# Patient Record
Sex: Male | Born: 1975 | Hispanic: No | Marital: Single | Smoking: Current some day smoker
Health system: Southern US, Community
[De-identification: ages and names within clinical notes are randomized; demographics above are authoritative.]

## PROBLEM LIST (undated history)

## (undated) DIAGNOSIS — F329 Major depressive disorder, single episode, unspecified: Secondary | ICD-10-CM

## (undated) DIAGNOSIS — F32A Depression, unspecified: Secondary | ICD-10-CM

## (undated) HISTORY — DX: Major depressive disorder, single episode, unspecified: F32.9

## (undated) HISTORY — DX: Depression, unspecified: F32.A

---

## 2017-07-29 ENCOUNTER — Other Ambulatory Visit: Payer: Self-pay | Admitting: Nurse Practitioner

## 2017-07-29 ENCOUNTER — Ambulatory Visit
Admission: RE | Admit: 2017-07-29 | Discharge: 2017-07-29 | Disposition: A | Discharge status: Home / Self Care | Payer: No Typology Code available for payment source | Source: Ambulatory Visit | Attending: Nurse Practitioner | Admitting: Nurse Practitioner

## 2017-07-29 DIAGNOSIS — R071 Chest pain on breathing: Secondary | ICD-10-CM

## 2017-08-06 ENCOUNTER — Encounter (HOSPITAL_COMMUNITY): Payer: Self-pay

## 2017-08-06 ENCOUNTER — Emergency Department (HOSPITAL_COMMUNITY)
Admission: EM | Admit: 2017-08-06 | Discharge: 2017-08-06 | Disposition: A | Discharge status: Home / Self Care | Payer: No Typology Code available for payment source | Attending: Emergency Medicine | Admitting: Emergency Medicine

## 2017-08-06 DIAGNOSIS — G4700 Insomnia, unspecified: Secondary | ICD-10-CM

## 2017-08-06 DIAGNOSIS — Z713 Dietary counseling and surveillance: Secondary | ICD-10-CM | POA: Insufficient documentation

## 2017-08-06 LAB — BASIC METABOLIC PANEL
ANION GAP: 8 (ref 5–15)
BUN: 10 mg/dL (ref 6–20)
CALCIUM: 9.2 mg/dL (ref 8.9–10.3)
CHLORIDE: 103 mmol/L (ref 101–111)
CO2: 25 mmol/L (ref 22–32)
Creatinine, Ser: 0.79 mg/dL (ref 0.61–1.24)
GFR calc non Af Amer: >60 mL/min (ref >60)
Glucose, Bld: 114 mg/dL — ABNORMAL HIGH (ref 65–99)
Potassium: 3.8 mmol/L (ref 3.5–5.1)
Sodium: 136 mmol/L (ref 135–145)

## 2017-08-06 LAB — CBC
HCT: 46.6 % (ref 39.0–52.0)
HEMOGLOBIN: 16.2 g/dL (ref 13.0–17.0)
MCH: 30.8 pg (ref 26.0–34.0)
MCHC: 34.8 g/dL (ref 30.0–36.0)
MCV: 88.6 fL (ref 78.0–100.0)
Platelets: 324 10*3/uL (ref 150–400)
RBC: 5.26 MIL/uL (ref 4.22–5.81)
RDW: 12.6 % (ref 11.5–15.5)
WBC: 8.3 10*3/uL (ref 4.0–10.5)

## 2017-08-06 LAB — URINALYSIS, ROUTINE W REFLEX MICROSCOPIC
Bilirubin Urine: NEGATIVE
Glucose, UA: NEGATIVE mg/dL
HGB URINE DIPSTICK: NEGATIVE
Ketones, ur: NEGATIVE mg/dL
Leukocytes, UA: NEGATIVE
Nitrite: NEGATIVE
Protein, ur: NEGATIVE mg/dL
SPECIFIC GRAVITY, URINE: 1.004 — AB (ref 1.005–1.030)
pH: 6 (ref 5.0–8.0)

## 2017-08-06 MED ORDER — ZOLPIDEM TARTRATE 5 MG PO TABS: 5.0000 mg | ORAL_TABLET | Freq: Every evening | ORAL | 0 refills | Status: DC | PRN

## 2017-08-06 NOTE — ED Triage Notes (Signed)
Pt states he started having weakness x 2 weeks and unable to sleep; pt states he was seen at clinic and was told his Cholesterol and WBC was elevated; pt is VAN negative. Pt a&ox 4 on arrival and denies pain

## 2017-08-06 NOTE — ED Provider Notes (Signed)
[facility]-EMERGENCY DEPT Provider Note   CSN: [id number] Arrival date & time: 08/06/17  0144   History   Chief Complaint Chief Complaint  Patient presents with  . Weakness    HPI Raymond Lara is a 41 y.o. male.  HPI  Patient with no significant past medical history comes to the ER for evaluation of not being able to sleep for two days and weakness.  A couple months ago he changed his diet to only eating tuna and chicken. He has lost a lot of weight and has felt progressively weak. He did this change to be healthy and for weight loss. He went to a clinic on 9/10 and had a CBC, CMP, lipid panel, and TSH drawn. His cholesterol was 201 (>200 abnormal) and his WBC was very slightly elevated. Now he notices he can't eat as much. He saw a friend he had not seen in a long time a few days ago who told him he looked sick because he is so thin and has been very stressed about this. He is having a hard time sleeping the past two days. He has tried drinking herbal tea and has been taking a supplement he got from [place]. He has not had any fevers, headaches, neck pain, back pain, coughing, he has not traveled out of the country recently.  Patient requests medication for sleep. That is his biggest concern.    Home Medications    Prior to Admission medications   Not on File    Family History No family history on file.  Social History Social History  Substance Use Topics  . Smoking status: Never Smoker  . Smokeless tobacco: Not on file  . Alcohol use No     Allergies   Patient has no known allergies.   Review of Systems Review of Systems  The patient denies anorexia, fever, vision loss, decreased hearing, hoarseness, chest pain, syncope, dyspnea on exertion, peripheral edema, balance deficits, hemoptysis, abdominal pain, melena, hematochezia, severe indigestion/heartburn, hematuria, incontinence, genital sores, muscle weakness, suspicious skin lesions, transient blindness, difficulty  walking, depression, unusual weight change, abnormal bleeding, enlarged lymph nodes, angioedema, and breast masses.  Physical Exam Updated Vital Signs BP (!) 131/91   Pulse 80   Temp 98.6 F (37 C) (Oral)   Resp 12   Ht  (1.676 m)   Wt 75.3 kg (166 lb)   SpO2 99%   BMI 26.79 kg/m   Physical Exam  Constitutional: He appears well-developed and well-nourished. No distress.  HENT:  Head: Normocephalic and atraumatic.  Right Ear: Tympanic membrane and ear canal normal.  Left Ear: Tympanic membrane and ear canal normal.  Nose: Nose normal.  Mouth/Throat: Uvula is midline, oropharynx is clear and moist and mucous membranes are normal.  Eyes: Pupils are equal, round, and reactive to light.  Neck: Normal range of motion. Neck supple.  Cardiovascular: Normal rate and regular rhythm.   Pulmonary/Chest: Effort normal.  Abdominal: Soft.  No signs of abdominal distention  Musculoskeletal:  No LE swelling  Neurological: He is alert.  Acting at baseline  Skin: Skin is warm and dry. No rash noted.  Nursing note and vitals reviewed.    ED Treatments / Results  Labs (all labs ordered are listed, but only abnormal results are displayed) Labs Reviewed  BASIC METABOLIC PANEL - Abnormal; Notable for the following:       Result Value   Glucose, Bld 114 (*)    All other components within normal limits  URINALYSIS, ROUTINE  W REFLEX MICROSCOPIC - Abnormal; Notable for the following:    Color, Urine STRAW (*)    Specific Gravity, Urine 1.004 (*)    All other components within normal limits  CBC  CBG MONITORING, ED    EKG  EKG Interpretation  Date/Time:  Tuesday August 06 2017 02:05:15 EDT Ventricular Rate:  79 PR Interval:  152 QRS Duration: 86 QT Interval:  348 QTC Calculation: 399 R Axis:   72 Text Interpretation:  Normal sinus rhythm Abnormal QRS-T angle, consider primary T wave abnormality Abnormal ECG T wave changes inferiorly No prior for comparison Confirmed by  Ross Marcus ([id number]) on 08/06/2017 5:53:14 AM Also confirmed by Ross Marcus ([id number]), editor Barbette Hair [phone])  on 08/06/2017 7:12:34 AM       Radiology No results found.  Procedures Procedures (including critical care time)  Medications Ordered in ED Medications - No data to display   Initial Impression / Assessment and Plan / ED Course  I have reviewed the triage vital signs and the nursing notes.  Pertinent labs & imaging results that were available during my care of the patient were reviewed by me and considered in my medical decision making (see chart for details).   Urinalysis, BMP, CBC and EKG are unremarkable. We discussed diet and weight loss. I advised that he needs to eat some carbs (chips, tortillas, bread, grains) and healthy fats (avocado) for energy or he will continue to feel weak. I will give him a resource guide to healthy eating.  He is otherwise well appearing. I have asked him to follow-up with his primary care clinic in 1-2 weeks for recheck after sleepign and making some of the recommended changes. -- I have asked nurse to weigh patient and get height to calculate BMI for trending.  Blood pressure 122/87, pulse 96, temperature 98.6 F (37 C), temperature source Oral, resp. rate (!) 21, SpO2 98 %. Weight is 166 lb  Raymond Lara has been evaluated today in the emergency department. The appropriate screening and testing was been performed and I believe the patient to be medically stable for discharge.   Return signs and symptoms have been discussed with the patient and/or caregivers and they have voiced their understanding. The patient has agreed to follow-up with their primary care provider or the referred specialist.    Final Clinical Impressions(s) / ED Diagnoses   Final diagnoses:  Insomnia, unspecified type  Encounter for weight loss counseling    New Prescriptions New Prescriptions   No medications on file     Marlon Pel,  PA-C 08/06/17 0804    Shon Baton, MD 08/07/17 [phone]

## 2017-08-06 NOTE — ED Notes (Signed)
ED Provider at bedside. 

## 2017-11-25 ENCOUNTER — Ambulatory Visit: Payer: Self-pay | Admitting: Physician Assistant

## 2017-11-25 ENCOUNTER — Other Ambulatory Visit: Payer: Self-pay

## 2017-11-25 ENCOUNTER — Encounter: Payer: Self-pay | Admitting: Physician Assistant

## 2017-11-25 VITALS — BP 147/94 | HR 97 | Temp 98.7°F | Resp 18 | Ht 64.96 in | Wt 180.2 lb

## 2017-11-25 DIAGNOSIS — F419 Anxiety disorder, unspecified: Secondary | ICD-10-CM

## 2017-11-25 DIAGNOSIS — F329 Major depressive disorder, single episode, unspecified: Secondary | ICD-10-CM

## 2017-11-25 DIAGNOSIS — G4700 Insomnia, unspecified: Secondary | ICD-10-CM

## 2017-11-25 MED ORDER — ESCITALOPRAM OXALATE 10 MG PO TABS: 10.0000 mg | ORAL_TABLET | Freq: Every day | ORAL | 0 refills | Status: AC

## 2017-11-25 MED ORDER — HYDROXYZINE HCL 25 MG PO TABS: 12.5000 mg | ORAL_TABLET | Freq: Every evening | ORAL | 0 refills | Status: AC | PRN

## 2017-11-25 NOTE — Progress Notes (Signed)
Raymond Lara  MRN: [id number] DOB: [birth year]  Subjective:  Raymond Lara is a 42 y.o. male seen in office today for a chief complaint of trouble sleeping x few months. He works as a Education administrator, gets home around [employer], listens to music, and tries to go to sleep around 9-10pm. Will have trouble falling asleep. Avoids TV and screen time before sleep. He does listen to music in headphones to ease mind before bed. Has had this issue in the past. He exercises frequently. Typically drinks coffee but not after 8am. Has been drinking at least 4 cans of beer at night to help sleep. Has tried melatonin for the past 3 days, with no relief.Does have some anxiety and depression x 4 years. Worsened over the past couple months due to holidays.  Sad because he is alone. He is anxious about the fact that he is alone. He is from [place] and most of his family is there. Does have one sister here. Has not been able to go home and visit. Has never seen a therapist for anxiety or depression.  Does often think he is ready to be with God but does not have any suicidal plans or hx of self injury. Deneis fever, chills, nausea, vomiting, chest pain, palpitations, and dizziness. Has also tried amytriptyline 10mg  x 2 months but then stopped, has restarted x 3 days ago at night time, did not help with sleep or depression. He was also given Rx for #10 ambien 5mg  tablets in 08/06/17, but he does not remember if these were effective. Denies smoking or illicit drug use. Stratus Interpreter used. Melanee Spry #[id number].  Review of Systems  Constitutional: Positive for fatigue. Negative for chills, diaphoresis and unexpected weight change.  Respiratory: Negative for cough and shortness of breath.   Cardiovascular: Negative for leg swelling.  Gastrointestinal: Negative for abdominal pain, nausea and vomiting.  Psychiatric/Behavioral: Negative for agitation, confusion and hallucinations.    There are no active problems to display for this  patient.   Current Outpatient Medications on File Prior to Visit  Medication Sig Dispense Refill  . lovastatin (MEVACOR) 10 MG tablet Take 10 mg by mouth at bedtime.    . Melatonin 10 MG CAPS Take 10 mg by mouth.    . niacin 250 MG tablet Take 250 mg by mouth at bedtime.    . ST JOHNS WORT EXTRACT PO Take 700 mg by mouth.     No current facility-administered medications on file prior to visit.     No Known Allergies   Objective:  BP (!) 147/94 (BP Location: Right Arm, Patient Position: Sitting)   Pulse 97   Temp 98.7 F (37.1 C) (Oral)   Resp 18   Ht 5' 4.96" (1.65 m)   Wt 180 lb 3.2 oz (81.7 kg)   SpO2 98%   BMI 30.02 kg/m   Physical Exam  Constitutional: He is oriented to person, place, and time and well-developed, well-nourished, and in no distress.  HENT:  Head: Normocephalic and atraumatic.  Eyes: Conjunctivae are normal.  Neck: Normal range of motion. No thyromegaly present.  Cardiovascular: Normal rate, regular rhythm and normal heart sounds.  Pulmonary/Chest: Effort normal and breath sounds normal. He has no wheezes. He has no rhonchi. He has no rales.  Neurological: He is alert and oriented to person, place, and time. Gait normal.  Skin: Skin is warm and dry.  Psychiatric: Affect normal. He exhibits a depressed mood.  Vitals reviewed.   BP Readings from Last 3 Encounters:  11/25/17 (!) 147/94  08/06/17 (!) 125/91   Depression screen PHQ 2/9 11/25/2017 11/25/2017  Decreased Interest 2 0  Down, Depressed, Hopeless 3 0  PHQ - 2 Score 5 0  Altered sleeping 3 -  Tired, decreased energy 3 -  Change in appetite 0 -  Feeling bad or failure about yourself  0 -  Trouble concentrating 0 -  Moving slowly or fidgety/restless 0 -  Suicidal thoughts 3 -  PHQ-9 Score 14 -   Unable to perform GAD7 due to language barrier.   Assessment and Plan :  1. Insomnia, unspecified type Per review of ED notes, labs revealed normal CBC and BMP. TSH pending. Hx suspicious for  uncontrolled anxiety and depression, likely contributing to insomnia. No acute findings noted on exam. Educated on sleep hygiene. Recommended avoiding alcohol use to help with sleep. Avoid earbuds with music while lying in bed. It appears that pt would strongly benefit from both medication and therapy for better control of anxiety and depression at this time.He is not actively suicidal and does not have a suicidal plan.  Recommend discontinuing amtriptyline at this time and begin lexapro daily. Educated on how to use hydroxyzine at night prn for help with racing thoughts while lexapro is taking effect. Educated on potential side effects of both medications. Pt gave me amitriptyline Rx bottle to discard remaining tablets. There were discarded by CMA Bonita QuinLinda. Given GoodRx card to take with him when he picks up Rx's.  Avoid combining new medication with alcohol.  Given contact info for local Spanish speaking therapists. Plan to follow up in 4 weeks for reevaluation or sooner if sx worsen or he develops new concerning sx.  - TSH  2. Anxiety and depression - escitalopram (LEXAPRO) 10 MG tablet; Take 1 tablet (10 mg total) by mouth daily.  Dispense: 90 tablet; Refill: 0 - hydrOXYzine (ATARAX/VISTARIL) 25 MG tablet; Take 0.5-1 tablets (12.5-25 mg total) by mouth at bedtime as needed for anxiety.  Dispense: 30 tablet; Refill: 0  Benjiman CoreBrittany Ravenne Wayment PA-C  [facility]  [facility] 11/25/2017 9:44 PM

## 2017-11-25 NOTE — Patient Instructions (Addendum)
For depression and anxiety, I recommend stopping amitryptiline and starting a daily SSRI and weekly/monthly therapy. I also recommend a short term medication for moments of panic attacks. The SSRI I am prescribing is called lexapro. Please keep in mind that when you start an SSRI it can worsen your depression and anxiety symptoms. It can also increase risk of suicidal thoughts so if this occurs, please seek care immediately. Common side effects of SSRIs include, but are not limited to, GI upset, nausea, vomiting, insomnia, and drowsiness. Typically these side effects will resolve after 2 weeks. Please keep in mind that it can take up to 4-6 weeks for SSRIs to be fully effective. In terms of relief of panic attacks, please use hydroxyzine as needed.  This medication can make you drowsy so please keep this in mind. Below is some information for local therapists. I recommend you reach out to one of these therapists before our next visit.Plan to return to clinic in 4 weeks for reevaluation of symptoms.    Para la depresin y la ansiedad, recomiendo suspender la amitriptilina y Games developer un ISRS diario y una terapia semanal / mensual. Tambin recomiendo un medicamento a corto plazo para los momentos de ataques de pnico. El ISRS que estoy recetando se llama lexapro. Tenga en cuenta que cuando inicia un ISRS puede empeorar sus sntomas de depresin y [place]. Tambin puede aumentar el riesgo de pensamientos suicidas, por lo que si esto ocurre, busque atencin de inmediato. Los [address] secundarios comunes de los ISRS [place], [place] otros, molestias gastrointestinales, nuseas, vmitos, insomnio y [place]. Normalmente estos efectos secundarios se resolvern despus de 2 semanas. Tenga en cuenta que puede tomar de 4 a 6 semanas para que los SSRI sean completamente efectivos. En cuanto al Google ataques de pnico, use hidroxizina segn sea necesario. Este medicamento puede causarle sueo, as que tenga esto en  cuenta. A continuacin se muestra informacin para los terapeutas locales. Le recomiendo que consulte a uno de estos terapeutas antes de nuestra prxima visita. Planee regresar a la clnica en 4 semanas para reevaluar los sntomas. Terapistas que hablan espanol:  1. Oswald Hillock, [phone] 2. Consejeria Ruiz, [phone] 3. Lelan Pons, [phone]     Insomnio (Insomnia) El insomnio es un trastorno del sueo que causa dificultades para conciliar el sueo o para [place]. Puede producir cansancio (fatiga), falta de energa, dificultad para concentrarse, cambios en el estado de nimo y mal rendimiento escolar o laboral. Hay tres formas diferentes de clasificar el insomnio:  Dificultad para conciliar el sueo.  Dificultad para mantener el sueo.  Despertar muy precoz por la maana. Cualquier tipo de insomnio puede ser a Air cabin crew (crnico) o a Product manager (agudo). Ambos son frecuentes. Generalmente, el insomnio a corto plazo dura tres meses o menos tiempo. El crnico ocurre al menos tres veces por semana durante ms de tres meses. CAUSAS El insomnio puede deberse a otra afeccin, situacin o sustancia, por ejemplo:  Ansiedad.  Algunos medicamentos.  Enfermedad por reflujo gastroesofgico (ERGE) u otras enfermedades gastrointestinales.  Asma y otras enfermedades respiratorias.  Sndrome de las piernas inquietas, apnea del sueo u otros trastornos del sueo.  Dolor crnico.  Menopausia, que puede incluir calores repentinos.  Ictus.  Consumo excesivo de alcohol, tabaco u drogas ilegales.  Depresin.  Cafena.  Trastornos neurolgicos, como enfermedad de Alzheimer.  Hiperactividad tiroidea (hipertiroidismo). Es posible que la causa del insomnio no se conozca. FACTORES DE RIESGO Los factores de riesgo de tener insomnio incluyen lo siguiente:  El sexo. La mujeres se ven ms afectadas que los hombres.  La edad. El insomnio es ms frecuente a  medida que una persona envejece.  El estrs. Esto puede incluir su vida profesional o personal.  Los ingresos. El insomnio es ms frecuente en las personas cuyos ingresos son ms bajos.  La falta de actividad fsica.  Los horarios de trabajo irregulares o los turnos nocturnos.  Los viajes a lugares de diferentes zonas horarias. SIGNOS Y SNTOMAS Si tiene insomnio, el sntoma principal es la dificultad para conciliar el sueo o mantenerlo. Esto puede derivar en otros sntomas, por ejemplo:  Sentirse fatigado.  Ponerse nervioso por [employer] Corporationtener que irse a dormir.  No sentirse descansado por la maana.  Tener dificultad para concentrarse.  Sentirse irritable, ansioso o deprimido. TRATAMIENTO El tratamiento para el insomnio depende de la causa. Si se debe a una enfermedad preexistente, el tratamiento se centrar en el abordaje de la enfermedad. El tratamiento tambin puede incluir lo siguiente:  Medicamentos que lo ayuden a dormir.  Asesoramiento psicolgico o terapia.  Cambios en el estilo de vida. INSTRUCCIONES PARA EL CUIDADO EN EL HOGAR  Tome los medicamentos solamente como se lo haya indicado el mdico.  Establezca horarios habituales para dormir y Chiropodistdespertarse. No tome siestas.  Lleve un registro del sueo ya que podra ser de utilidad para que usted y a su mdico puedan determinar qu podra estar causndole insomnio. Incluya lo siguiente: ? Cundo duerme. ? Cundo se despierta durante la noche. ? Qu tan bien duerme. ? Qu tan relajado se siente al da siguiente. ? Cualquier efecto secundario de los [place] Energymedicamentos que toma. ? Lo que usted come y bebe.  Convierta a su habitacin en un lugar cmodo donde sea fcil conciliar el sueo: ? Coloque persianas o cortinas especiales oscuras que impidan la entrada de la luz del exterior. ? Para bloquear los ruidos, use un aparato que reproduce sonidos ambientales o relajantes de fondo. ? Mantenga baja la temperatura.  Haga ejercicio  regularmente como se lo haya indicado el mdico. No haga ejercicio justo antes de la hora de [place]ostarse.  Utilice tcnicas de relajacin para controlar el estrs. Pdale al mdico que le sugiera algunas tcnicas que sean adecuadas para usted. Estos pueden incluir lo siguiente: ? Ejercicios de respiracin. ? Rutinas para aliviar la tensin muscular. ? Visualizacin de escenas apacibles.  Disminucin del consumo de alcohol, bebidas con cafena y cigarrillos, especialmente cerca de la hora de [place]acostarse, ya que pueden perturbarle el sueo.  No coma en exceso ni consuma comidas picantes justo antes de la hora de [place] Heightsacostarse. Esto puede causarle molestias digestivas y dificultades para dormir.  Limite el uso de pantallas antes de la hora de [place] Centeracostarse. Esto incluye lo siguiente: ? Mirar televisin. ? Usar el telfono inteligente, la tableta y la computadora.  Siga una rutina. Esto puede ayudarlo a conciliar el sueo ms rpidamente. Intente hacer una actividad tranquila, cepillarse los dientes e irse a la cama a la misma hora todas las noches.  Levntese de la cama si sigue despierto despus de 15minutos de haber intentado dormirse. Mantenga bajas las luces, pero intente leer o hacer una actividad tranquila. Cuando tenga sueo, regrese a Pharmacist, hospitalla cama.  Conduzca con cuidado. No conduzca si est muy somnoliento.  Concurra a todas las visitas de control, segn le indique su mdico. Esto es importante. SOLICITE ATENCIN MDICA SI:  Est cansado durante el da o tiene dificultades en su rutina diaria debido a la somnolencia.  Sigue teniendo problemas para  dormir o Teacher, English as a foreign language. SOLICITE ATENCIN MDICA DE INMEDIATO SI:  Tiene pensamientos serios acerca de lastimarse a usted mismo o daar a Engineer, maintenance (IT). Esta informacin no tiene Theme park manager el consejo del mdico. Asegrese de hacerle al mdico cualquier pregunta que tenga. Document Released: 10/29/2005 Document Revised: 11/19/2014 Document  Reviewed: 07/30/2014 Elsevier Interactive Patient Education  2018 [employer].  IF you received an x-ray today, you will receive an invoice from [facility]. Please contact [facility] at [phone] with questions or concerns regarding your invoice.   IF you received labwork today, you will receive an invoice from [facility]. Please contact [facility] at [phone] with questions or concerns regarding your invoice.   Our billing staff will not be able to assist you with questions regarding bills from these companies.  You will be contacted with the lab results as soon as they are available. The fastest way to get your results is to activate your My Chart account. Instructions are located on the last page of this paperwork. If you have not heard from [place] regarding the results in 2 weeks, please contact this office.

## 2017-11-26 LAB — TSH: TSH: 2.07 u[IU]/mL (ref 0.450–4.500)

## 2017-12-25 ENCOUNTER — Ambulatory Visit: Payer: Self-pay | Admitting: Physician Assistant

## 2019-12-02 ENCOUNTER — Emergency Department (HOSPITAL_COMMUNITY)
Admission: EM | Admit: 2019-12-02 | Discharge: 2019-12-02 | Disposition: A | Discharge status: Home / Self Care | Payer: Self-pay | Attending: Emergency Medicine | Admitting: Emergency Medicine

## 2019-12-02 ENCOUNTER — Encounter (HOSPITAL_COMMUNITY): Payer: Self-pay | Admitting: Emergency Medicine

## 2019-12-02 ENCOUNTER — Emergency Department (HOSPITAL_COMMUNITY): Payer: Self-pay

## 2019-12-02 ENCOUNTER — Other Ambulatory Visit: Payer: Self-pay

## 2019-12-02 DIAGNOSIS — M25512 Pain in left shoulder: Secondary | ICD-10-CM | POA: Insufficient documentation

## 2019-12-02 DIAGNOSIS — Z720 Tobacco use: Secondary | ICD-10-CM | POA: Insufficient documentation

## 2019-12-02 DIAGNOSIS — Z79899 Other long term (current) drug therapy: Secondary | ICD-10-CM | POA: Insufficient documentation

## 2019-12-02 DIAGNOSIS — R030 Elevated blood-pressure reading, without diagnosis of hypertension: Secondary | ICD-10-CM | POA: Insufficient documentation

## 2019-12-02 DIAGNOSIS — M25511 Pain in right shoulder: Secondary | ICD-10-CM | POA: Insufficient documentation

## 2019-12-02 LAB — CBC
HCT: 53.5 % — ABNORMAL HIGH (ref 39.0–52.0)
Hemoglobin: 18.1 g/dL — ABNORMAL HIGH (ref 13.0–17.0)
MCH: 30.2 pg (ref 26.0–34.0)
MCHC: 33.8 g/dL (ref 30.0–36.0)
MCV: 89.2 fL (ref 80.0–100.0)
Platelets: 383 10*3/uL (ref 150–400)
RBC: 6 MIL/uL — ABNORMAL HIGH (ref 4.22–5.81)
RDW: 12.5 % (ref 11.5–15.5)
WBC: 9.6 10*3/uL (ref 4.0–10.5)
nRBC: 0 % (ref 0.0–0.2)

## 2019-12-02 LAB — BASIC METABOLIC PANEL
Anion gap: 12 (ref 5–15)
BUN: 12 mg/dL (ref 6–20)
CO2: 22 mmol/L (ref 22–32)
Calcium: 9.6 mg/dL (ref 8.9–10.3)
Chloride: 100 mmol/L (ref 98–111)
Creatinine, Ser: 0.96 mg/dL (ref 0.61–1.24)
GFR calc Af Amer: >60 mL/min (ref >60)
GFR calc non Af Amer: >60 mL/min (ref >60)
Glucose, Bld: 111 mg/dL — ABNORMAL HIGH (ref 70–99)
Potassium: 4.5 mmol/L (ref 3.5–5.1)
Sodium: 134 mmol/L — ABNORMAL LOW (ref 135–145)

## 2019-12-02 LAB — TROPONIN I (HIGH SENSITIVITY)
Troponin I (High Sensitivity): 14 ng/L (ref <18)
Troponin I (High Sensitivity): 13 ng/L (ref <18)

## 2019-12-02 MED ORDER — SODIUM CHLORIDE 0.9% FLUSH: 3.0000 mL | Freq: Once | INTRAVENOUS | Status: DC

## 2019-12-02 NOTE — ED Triage Notes (Addendum)
Pt to triage via EMS.  C/o hypertension, dizziness, "odd sensation" to bilateral hands, pain across shoulders, and SOB since 11am.  No neuro deficits noted on triage exam.

## 2019-12-02 NOTE — ED Provider Notes (Signed)
[facility] EMERGENCY DEPARTMENT Provider Note   CSN: [id number] Arrival date & time: 12/02/19  1435     History Chief Complaint  Patient presents with  . Hypertension  . Shoulder Pain  . Dizziness    Raymond Lara is a 44 y.o. male with past medical history significant for depression presents to emergency department today with chief complaints of intermittent hypertension, shoulder pain, and chest pain x 3 months. Patient describes the chest pain as more of a chest pressure. He states the pain initially was only present mostly when eating however he has been noticing it at rest. Chest pressure is not worse with exertion and he denies any diaphoresis. He recently bought a wrist blood pressure monitor. He has been checking his pressures when he feels dizzy. He has a picture on his phone showing the monitor with readings of 142/107 and 147/102. He has also been checking his blood glucose with a monitor from [facility] and has readings ranging from 102-129. Patient states sometimes when he breathes fast his he'll feel numbness in bilateral hands. He also bought a medication from [place] to help with hair growth and a side effect listed was high blood pressure, when he first noticed the elevated pressures he discontinued the medication. He describes the shoulder pain as located in bilateral shoulders. He states pain is worse after working. He works as Education administrator. He currently does not have shoulder pain. He has been taking an OTC medication for high cholesterol from [place]. He has never formally been diagnosed with high cholesterol, diabetes, hypertension. He does not have a primary care provider.   He denies family history of heart disease. He denies tobacco use. Denies dyspnea on exertion, SOB, chest tightness, radiation to left/right arm, jaw or back, nausea, or diaphoresis.   Due to language barrier, a video interpreter was present during the history-taking and subsequent discussion  (and for part of the physical exam) with this patient.      Past Medical History:  Diagnosis Date  . Depression     There are no problems to display for this patient.   History reviewed. No pertinent surgical history.     Family History  Problem Relation Age of Onset  . Heart disease Mother   . Diabetes Brother   . Hypertension Brother     Social History   Tobacco Use  . Smoking status: Current Some Day Smoker  . Smokeless tobacco: Never Used  Substance Use Topics  . Alcohol use: Yes    Alcohol/week: 8.0 standard drinks    Types: 8 Cans of beer per week  . Drug use: No    Home Medications Prior to Admission medications   Medication Sig Start Date End Date Taking? Authorizing Provider  escitalopram (LEXAPRO) 10 MG tablet Take 1 tablet (10 mg total) by mouth daily. 11/25/17   Benjiman Core D, PA-C  hydrOXYzine (ATARAX/VISTARIL) 25 MG tablet Take 0.5-1 tablets (12.5-25 mg total) by mouth at bedtime as needed for anxiety. 11/25/17   Benjiman Core D, PA-C  lovastatin (MEVACOR) 10 MG tablet Take 10 mg by mouth at bedtime.    [provider]  Melatonin 10 MG CAPS Take 10 mg by mouth.    [provider]  niacin 250 MG tablet Take 250 mg by mouth at bedtime.    [provider]  ST JOHNS WORT EXTRACT PO Take 700 mg by mouth.    [provider]    Allergies    Patient has no  known allergies.  Review of Systems   Review of Systems  All other systems are reviewed and are negative for acute change except as noted in the HPI.   Physical Exam Updated Vital Signs BP (!) 126/95 (BP Location: Right Arm)   Pulse 88   Temp 98.6 F (37 C) (Oral)   Resp 16   SpO2 100%   Physical Exam Vitals and nursing note reviewed.  Constitutional:      General: He is not in acute distress.    Appearance: He is not ill-appearing.  HENT:     Head: Normocephalic and atraumatic.     Right Ear: Tympanic membrane and external ear normal.      Left Ear: Tympanic membrane and external ear normal.     Nose: Nose normal.     Mouth/Throat:     Mouth: Mucous membranes are moist.     Pharynx: Oropharynx is clear.  Eyes:     General: No scleral icterus.       Right eye: No discharge.        Left eye: No discharge.     Extraocular Movements: Extraocular movements intact.     Conjunctiva/sclera: Conjunctivae normal.     Pupils: Pupils are equal, round, and reactive to light.  Neck:     Vascular: No JVD.  Cardiovascular:     Rate and Rhythm: Normal rate and regular rhythm.     Pulses: Normal pulses.          Radial pulses are 2+ on the right side and 2+ on the left side.     Heart sounds: Normal heart sounds.  Pulmonary:     Comments: Lungs clear to auscultation in all fields. Symmetric chest rise. No wheezing, rales, or rhonchi. Chest:     Chest wall: No tenderness.  Abdominal:     Comments: Abdomen is soft, non-distended, and non-tender in all quadrants. No rigidity, no guarding. No peritoneal signs.  Musculoskeletal:        General: Normal range of motion.     Cervical back: Normal range of motion.     Right lower leg: No edema.     Left lower leg: No edema.     Comments: Full range of motion of all extremities.  He is moving them without signs of injury.  Skin:    General: Skin is warm and dry.     Capillary Refill: Capillary refill takes less than 2 seconds.  Neurological:     Mental Status: He is oriented to person, place, and time.     GCS: GCS eye subscore is 4. GCS verbal subscore is 5. GCS motor subscore is 6.     Comments: Fluent speech, no facial droop.  Speech is clear and goal oriented, follows commands CN III-XII intact, no facial droop Normal strength in upper and lower extremities bilaterally including dorsiflexion and plantar flexion, strong and equal grip strength Sensation normal to light and sharp touch Moves extremities without ataxia, coordination intact Normal finger to nose and rapid alternating  movements Normal gait and balance   Psychiatric:        Behavior: Behavior normal.       ED Results / Procedures / Treatments   Labs (all labs ordered are listed, but only abnormal results are displayed) Labs Reviewed  BASIC METABOLIC PANEL - Abnormal; Notable for the following components:      Result Value   Sodium 134 (*)    Glucose, Bld 111 (*)  All other components within normal limits  CBC - Abnormal; Notable for the following components:   RBC 6.00 (*)    Hemoglobin 18.1 (*)    HCT 53.5 (*)    All other components within normal limits  TROPONIN I (HIGH SENSITIVITY)  TROPONIN I (HIGH SENSITIVITY)    EKG EKG Interpretation  Date/Time:  Wednesday December 02 2019 14:50:11 EST Ventricular Rate:  86 PR Interval:  150 QRS Duration: 78 QT Interval:  344 QTC Calculation: 411 R Axis:   84 Text Interpretation: Normal sinus rhythm Normal ECG t wave changes inferiorally have resolved Confirmed by Jacalyn Lefevre [phone]) on 12/02/2019 4:14:46 PM   Radiology DG Chest 2 View  Result Date: 12/02/2019 CLINICAL DATA:  Shortness of breath EXAM: CHEST - 2 VIEW COMPARISON:  07/29/2017 FINDINGS: The heart size and mediastinal contours are within normal limits. Both lungs are clear. The visualized skeletal structures are unremarkable. IMPRESSION: No active cardiopulmonary disease. Electronically Signed   By: Duanne Guess D.O.   On: 12/02/2019 15:09    Procedures Procedures (including critical care time)  Medications Ordered in ED Medications  sodium chloride flush (NS) 0.9 % injection 3 mL (3 mLs Intravenous Not Given 12/02/19 1701)    ED Course  I have reviewed the triage vital signs and the nursing notes.  Pertinent labs & imaging results that were available during my care of the patient were reviewed by me and considered in my medical decision making (see chart for details).  Vitals:   12/02/19 1445 12/02/19 1849  BP: (!) 126/95 (!) 134/92  Pulse: 88 88  Resp: 16  18  Temp: 98.6 F (37 C)   TempSrc: Oral   SpO2: 100% 98%      MDM Rules/Calculators/A&P                      Patient seen and examined. Patient presents awake, alert, hemodynamically stable, afebrile, non toxic.  His lungs are clear to auscultation all fields, abdomen is nontender.  Neuro exam is normal. He has full range of motion of bilateral shoulders, no signs of injury. No tenderness on exam.  CBC and BMP without significant findings.  Delta troponins flat. I viewed pt's chest xray and it does not suggest acute infectious processes.  EKG shows normal sinus rhythm, no STEMI.  No signs of hypertensive urgency or emergency. He has low risk heart score of 1.  Patient is low risk wells, PERC negative, doubt pulmonary embolism. Pain is not a tearing sensation, symmetric pulses, no widening of mediastinum on CXR, doubt dissection. 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   Patient has appeared hemodynamically stable throughout ER visit and appears safe for discharge with close pcp follow up. I discussed results, treatment plan, need for PCP follow-up, and return precautions with the patient. Provided opportunity for questions, patient confirmed understanding and is in agreement with plan.   Portions of this note were generated with Scientist, clinical (histocompatibility and immunogenetics). Dictation errors may occur despite best attempts at proofreading.   Final Clinical Impression(s) / ED Diagnoses Final diagnoses:  Acute pain of both shoulders  Elevated blood pressure reading    Rx / DC Orders ED Discharge Orders    None       Kathyrn Lass 12/02/19 1859    Jacalyn Lefevre, MD 12/02/19 1943

## 2019-12-02 NOTE — Discharge Instructions (Addendum)
You have been seen today for high blood pressure, dizziness, shoulder pain. Please read and follow all provided instructions. Return to the emergency room for worsening condition or new concerning symptoms.    1. Medications:  Continue usual home medications Take medications as prescribed. Please review all of the medicines and only take them if you do not have an allergy to them.   2. Treatment: rest, drink plenty of fluids  3. Follow Up:  Please follow up with primary care provider by scheduling an appointment as soon as possible for a visit  If you do not have a primary care physician, contact [facility] at [phone] for referral   ?

## 2019-12-02 NOTE — ED Notes (Signed)
Snack given to the pt.

## 2019-12-02 NOTE — ED Notes (Signed)
All appropriate discharge materials reviewed at length with patient. Time for questions provided. Pt has no other questions at this time and verbalizes understanding of all provided materials.  

## 2020-06-27 IMAGING — CR DG CHEST 2V
2 series · 2 of 2 positions shown · non-contrast
Comparison: 07/29/2017

CLINICAL DATA: Shortness of breath

EXAM:
CHEST - 2 VIEW

[chest pa]
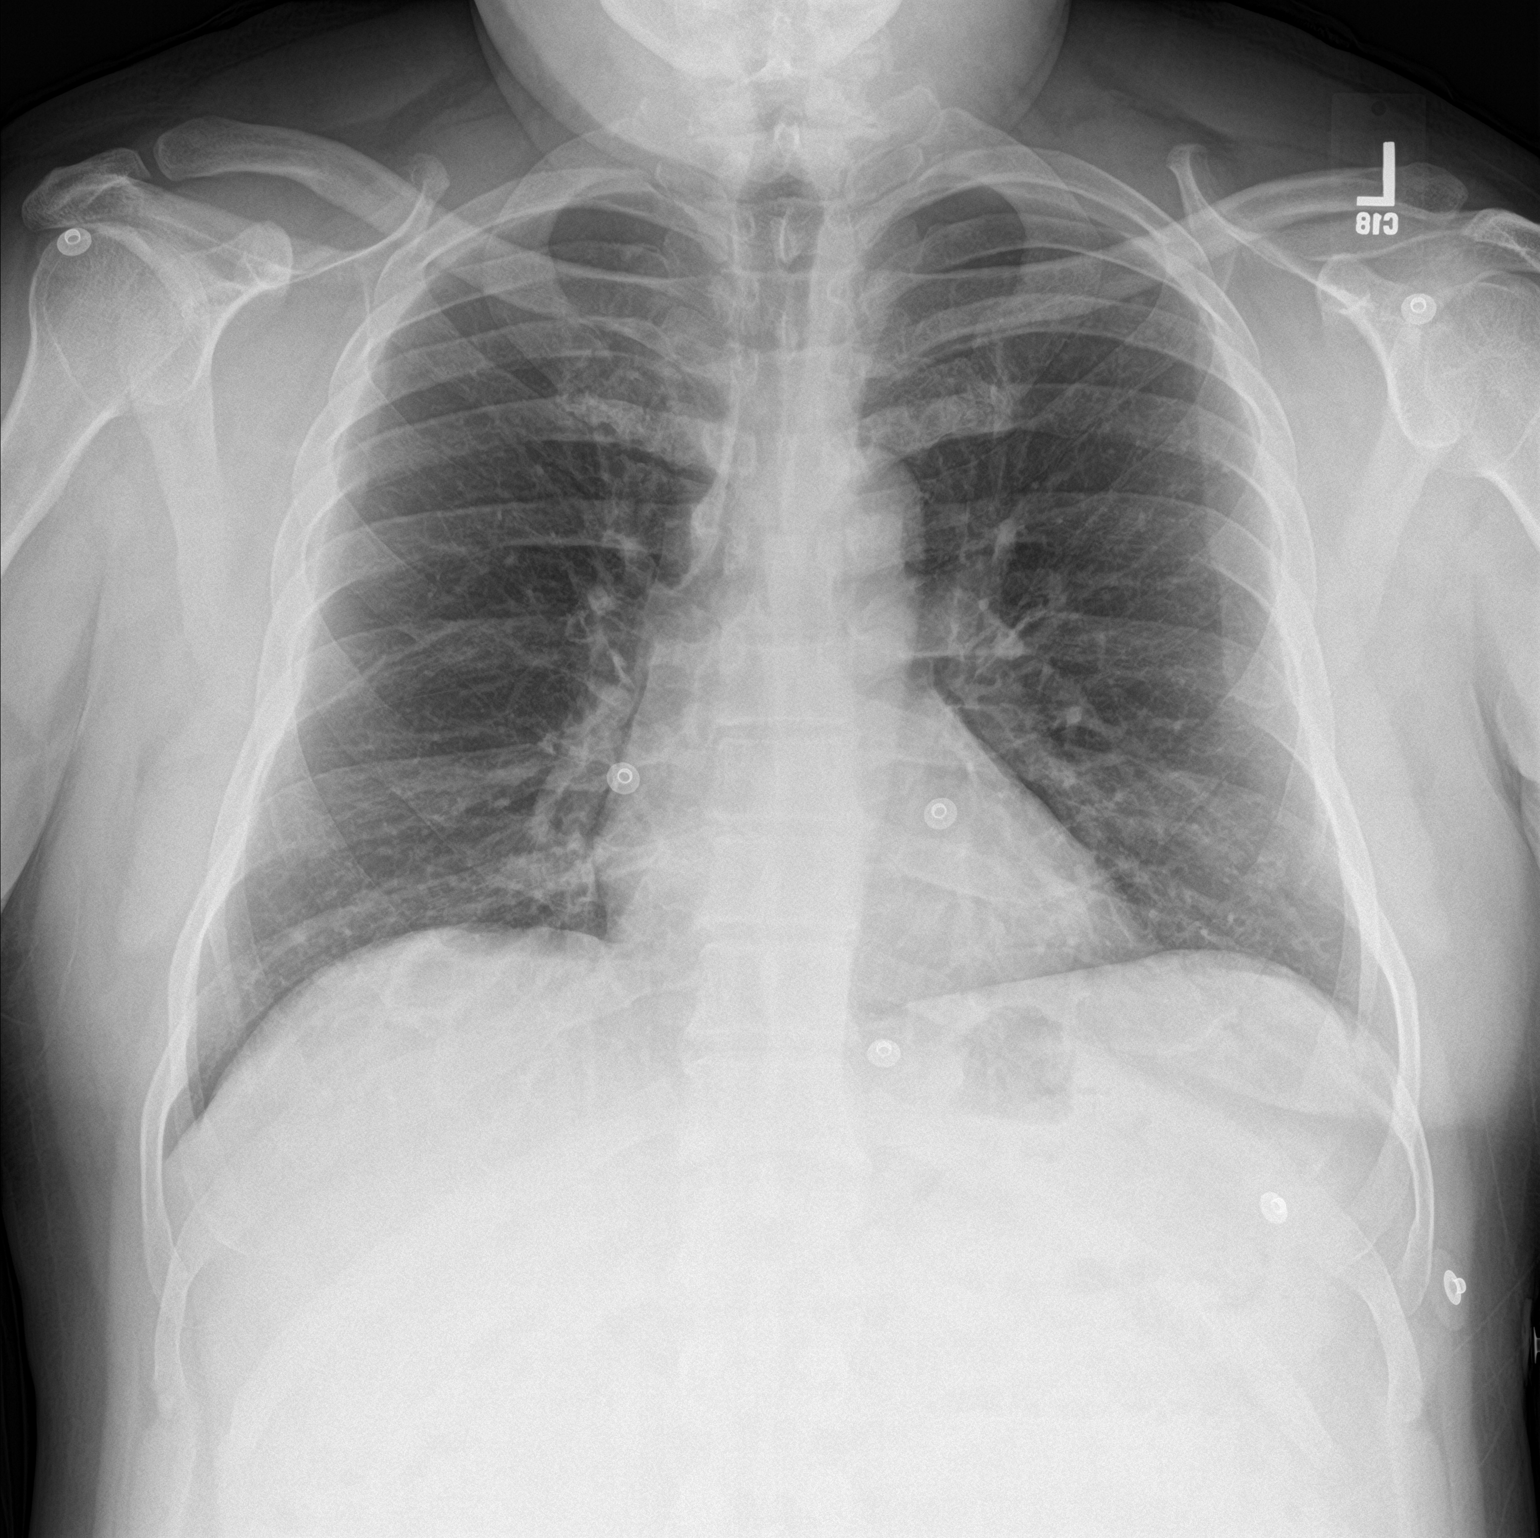

[chest lat]
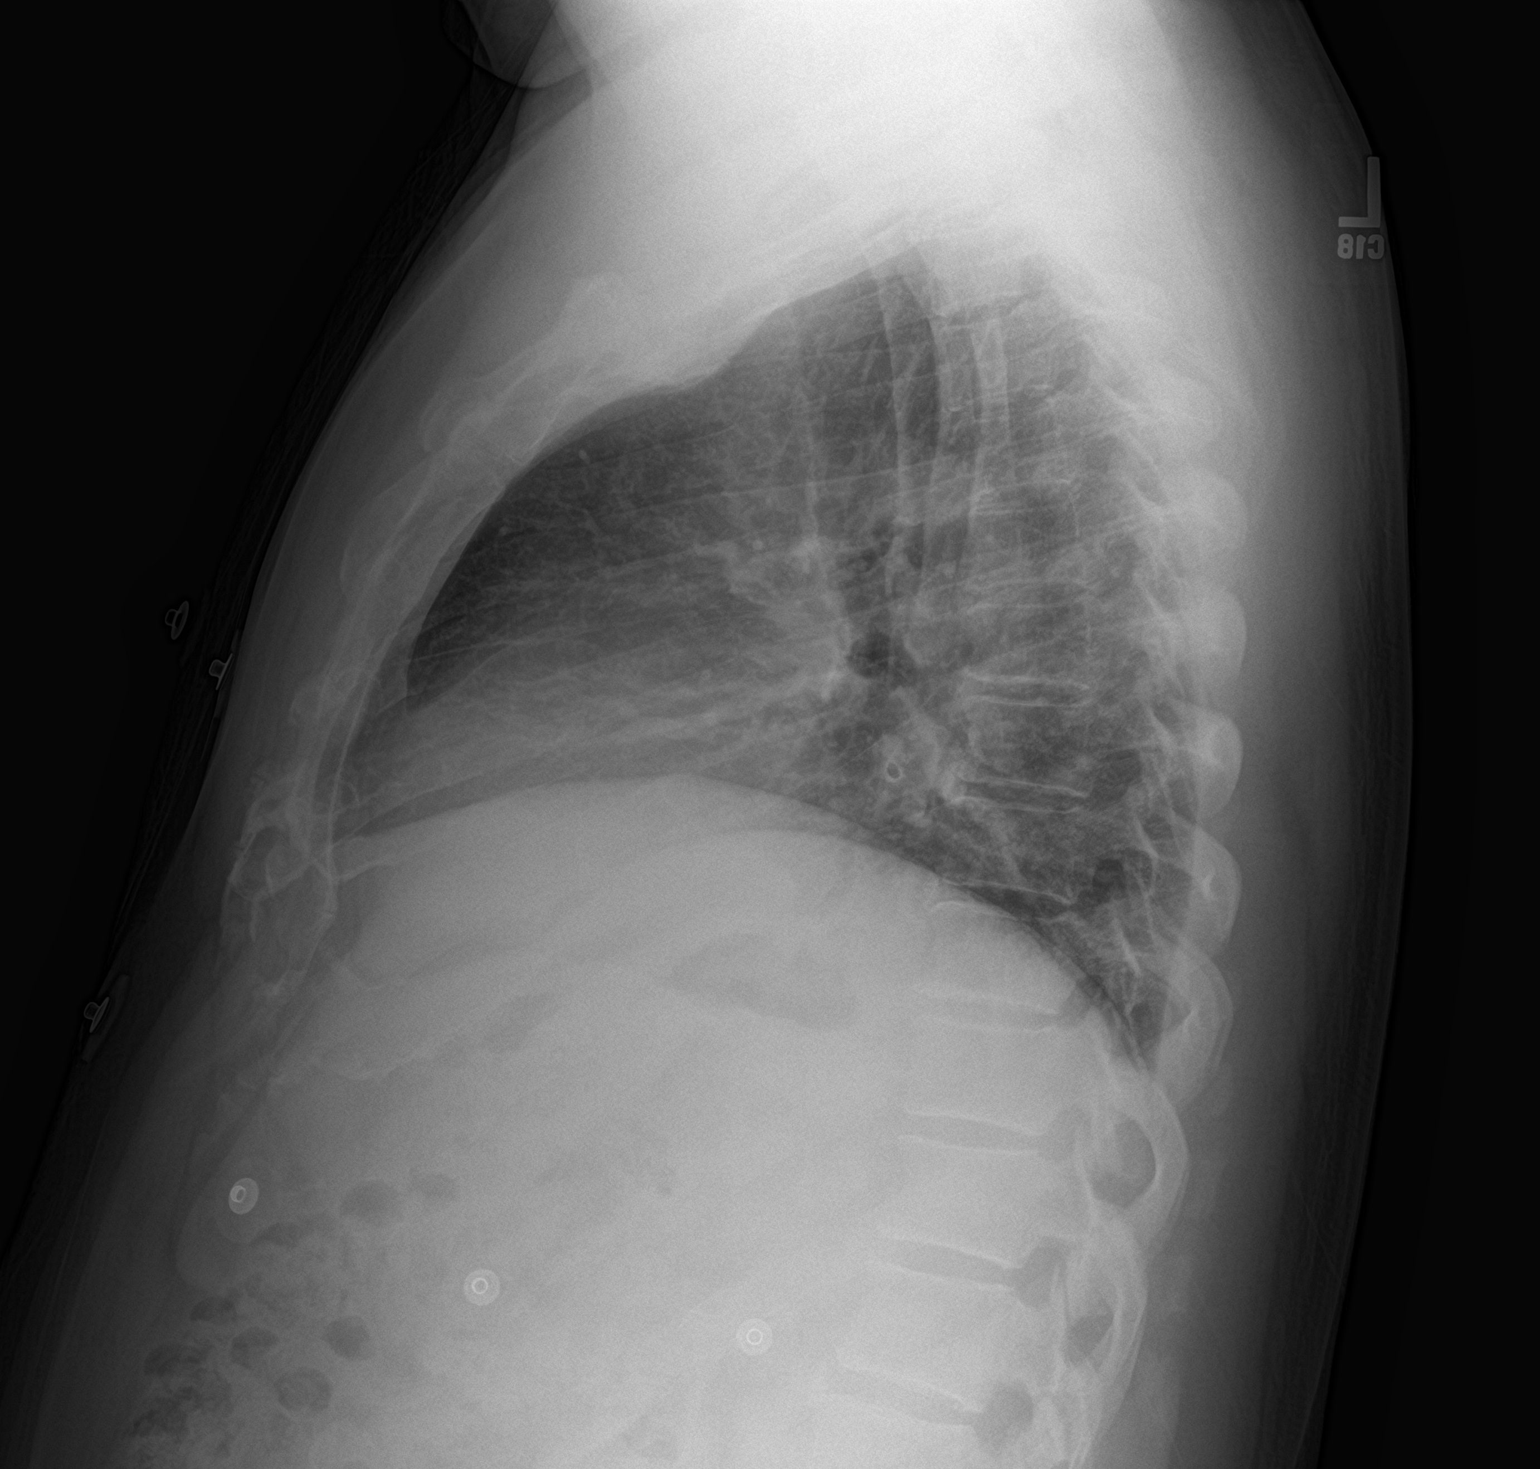

[2 of 2 positions shown; findings below may reference images not displayed]

FINDINGS: The heart size and mediastinal contours are within normal limits.
Both lungs are clear. The visualized skeletal structures are
unremarkable.
IMPRESSION: No active cardiopulmonary disease.
